# Patient Record
Sex: Male | Born: 1990 | Race: White | Hispanic: No | Marital: Single | Smoking: Former smoker
Health system: Southern US, Community
[De-identification: ages and names within clinical notes are randomized; demographics above are authoritative.]

## PROBLEM LIST (undated history)

## (undated) DIAGNOSIS — K219 Gastro-esophageal reflux disease without esophagitis: Secondary | ICD-10-CM

## (undated) DIAGNOSIS — B019 Varicella without complication: Secondary | ICD-10-CM

## (undated) HISTORY — DX: Gastro-esophageal reflux disease without esophagitis: K21.9

## (undated) HISTORY — DX: Varicella without complication: B01.9

---

## 2014-10-02 ENCOUNTER — Other Ambulatory Visit (INDEPENDENT_AMBULATORY_CARE_PROVIDER_SITE_OTHER): Payer: BLUE CROSS/BLUE SHIELD

## 2014-10-02 ENCOUNTER — Ambulatory Visit (INDEPENDENT_AMBULATORY_CARE_PROVIDER_SITE_OTHER): Payer: BLUE CROSS/BLUE SHIELD | Admitting: Family

## 2014-10-02 ENCOUNTER — Encounter: Payer: Self-pay | Admitting: Family

## 2014-10-02 ENCOUNTER — Telehealth: Payer: Self-pay | Admitting: Family

## 2014-10-02 VITALS — BP 134/78 | HR 95 | Temp 98.2°F | Resp 18 | Ht 70.0 in | Wt 190.0 lb

## 2014-10-02 DIAGNOSIS — R1013 Epigastric pain: Secondary | ICD-10-CM

## 2014-10-02 DIAGNOSIS — R748 Abnormal levels of other serum enzymes: Secondary | ICD-10-CM

## 2014-10-02 LAB — AMYLASE: AMYLASE: 46 U/L (ref 27–131)

## 2014-10-02 LAB — H. PYLORI ANTIBODY, IGG: H Pylori IgG: NEGATIVE

## 2014-10-02 LAB — LIPASE: LIPASE: 63 U/L — AB (ref 11.0–59.0)

## 2014-10-02 MED ORDER — PANTOPRAZOLE SODIUM 40 MG PO TBEC: 40.0000 mg | DELAYED_RELEASE_TABLET | Freq: Every day | ORAL | Status: AC

## 2014-10-02 NOTE — Telephone Encounter (Signed)
Please inform patient that his lipase was elevated, however the rest of his blood work was normal. This could be the potential for gall stones. Therefore I am placing an order for an abdominal ultrasound. Please continue to take the probiotic and pantoprazole at this time. We will follow up once he completes the ultrasound.

## 2014-10-02 NOTE — Progress Notes (Signed)
   Subjective:    Patient ID: Shawn Lee, male    DOB: [birth year], 24 y.o.   MRN: [id number]  Chief Complaint  Patient presents with  . Establish Care    States that he doesn't think that he digests food the way that he should, thinks it might be IBS, never been diagnosed    HPI:  [name] is a 24 y.o. male with no significant PMH who presents today for an office visit to establish care.   1.) Abdominal issues - Associated symptom of pain located in his upper and lower abdomen that has been going on and off for about 2 years has progressively worsened over the past month. Pain is described as a burning sensation. Pain is not effected by bowel movements. Notes that he can have multiple episodes of diarreha ranging from 1-4 per day. Greasy foods that has caused his symptoms. Averages about 3 meals per day consisting of red meat, pork, minimal vegetables and fruit, and some dairy. Denies blood in his stools. Modifying factors include TUMs which dulls the pain but then it returns. Empty stomach is worse. Timing of symptoms can be worse at night.   No Known Allergies  No current outpatient prescriptions on file prior to visit.   No current facility-administered medications on file prior to visit.     Review of Systems  Constitutional: Negative for fever and chills.  Gastrointestinal: Positive for abdominal pain and diarrhea. Negative for nausea, vomiting and blood in stool.      Objective:    BP 134/78 mmHg  Pulse 95  Temp(Src) 98.2 F (36.8 C) (Oral)  Resp 18  Ht  (1.778 m)  Wt 190 lb (86.183 kg)  BMI 27.26 kg/m2  SpO2 98% Nursing note and vital signs reviewed.  Physical Exam  Constitutional: He is oriented to person, place, and time. He appears well-developed and well-nourished. No distress.  Cardiovascular: Normal rate, regular rhythm, normal heart sounds and intact distal pulses.   Pulmonary/Chest: Effort normal and breath sounds normal.  Abdominal: Soft.  Normal appearance and bowel sounds are normal. There is tenderness in the right upper quadrant and epigastric area. There is no rigidity, no rebound, no guarding, no CVA tenderness, no tenderness at McBurney's point and negative Murphy's sign.  Neurological: He is alert and oriented to person, place, and time.  Skin: Skin is warm and dry.  Psychiatric: He has a normal mood and affect. His behavior is normal. Judgment and thought content normal.       Assessment & Plan:   Problem List Items Addressed This Visit      Other   Epigastric pain - Primary    Epigastric pain of undetermined origin, however cannot rule out gall stones, peptic ulcer disease, or irritable bowel syndrome. Obtain lipase, amylase, and H. Pylorii. Start pantoprazole. Start probiotic. Follow up pending lab work and trial of pantoprazole.       Relevant Medications   pantoprazole (PROTONIX) 40 MG tablet   Other Relevant Orders   Lipase (Completed)   Amylase (Completed)   H. pylori antibody, IgG (Completed)

## 2014-10-02 NOTE — Progress Notes (Signed)
Pre visit review using our clinic review tool, if applicable. No additional management support is needed unless otherwise documented below in the visit note. 

## 2014-10-02 NOTE — Assessment & Plan Note (Signed)
Epigastric pain of undetermined origin, however cannot rule out gall stones, peptic ulcer disease, or irritable bowel syndrome. Obtain lipase, amylase, and H. Pylorii. Start pantoprazole. Start probiotic. Follow up pending lab work and trial of pantoprazole.

## 2014-10-02 NOTE — Patient Instructions (Addendum)
Thank you for choosing [facility].  Summary/Instructions:  Your prescription(s) have been submitted to your pharmacy or been printed and provided for you. Please take as directed and contact our office if you believe you are having problem(s) with the medication(s) or have any questions.  Please stop by the lab on the basement level of the building for your blood work. Your results will be released to MyChart (or called to you) after review, usually within 72 hours after test completion. If any changes need to be made, you will be notified at that same time.  If your symptoms worsen or fail to improve, please contact our office for further instruction, or in case of emergency go directly to the emergency room at the closest medical facility.   Irritable Bowel Syndrome Irritable bowel syndrome (IBS) is caused by a disturbance of normal bowel function and is a common digestive disorder. You may also hear this condition called spastic colon, mucous colitis, and irritable colon. There is no cure for IBS. However, symptoms often gradually improve or disappear with a good diet, stress management, and medicine. This condition usually appears in late adolescence or early adulthood. Women develop it twice as often as men. CAUSES  After food has been digested and absorbed in the small intestine, waste material is moved into the large intestine, or colon. In the colon, water and salts are absorbed from the undigested products coming from the small intestine. The remaining residue, or fecal material, is held for elimination. Under normal circumstances, gentle, rhythmic contractions of the bowel walls push the fecal material along the colon toward the rectum. In IBS, however, these contractions are irregular and poorly coordinated. The fecal material is either retained too long, resulting in constipation, or expelled too soon, producing diarrhea. SIGNS AND SYMPTOMS  The most common symptom of IBS is  abdominal pain. It is often in the lower left side of the abdomen, but it may occur anywhere in the abdomen. The pain comes from spasms of the bowel muscles happening too much and from the buildup of gas and fecal material in the colon. This pain:  Can range from sharp abdominal cramps to a dull, continuous ache.  Often worsens soon after eating.  Is often relieved by having a bowel movement or passing gas. Abdominal pain is usually accompanied by constipation, but it may also produce diarrhea. The diarrhea often occurs right after a meal or upon waking up in the morning. The stools are often soft, watery, and flecked with mucus. Other symptoms of IBS include:  Bloating.  Loss of appetite.  Heartburn.  Backache.  Dull pain in the arms or shoulders.  Nausea.  Burping.  Vomiting.  Gas. IBS may also cause symptoms that are unrelated to the digestive system, such as:  Fatigue.  Headaches.  Anxiety.  Shortness of breath.  Trouble concentrating.  Dizziness. These symptoms tend to come and go. DIAGNOSIS  The symptoms of IBS may seem like symptoms of other, more serious digestive disorders. Your health care provider may want to perform tests to exclude these disorders.  TREATMENT Many medicines are available to help correct bowel function or relieve bowel spasms and abdominal pain. Among the medicines available are:  Laxatives for severe constipation and to help restore normal bowel habits.  Specific antidiarrheal medicines to treat severe or lasting diarrhea.  Antispasmodic agents to relieve intestinal cramps. Your health care provider may also decide to treat you with a mild tranquilizer or sedative during unusually stressful periods in  your life. Your health care provider may also prescribe antidepressant medicine. The use of this medicine has been shown to reduce pain and other symptoms of IBS. Remember that if any medicine is prescribed for you, you should take it  exactly as directed. Make sure your health care provider knows how well it worked for you. HOME CARE INSTRUCTIONS   Take all medicines as directed by your health care provider.  Avoid foods that are high in fat or oils, such as heavy cream, butter, frankfurters, sausage, and other fatty meats.  Avoid foods that make you go to the bathroom, such as fruit, fruit juice, and dairy products.  Cut out carbonated drinks, chewing gum, and "gassy" foods such as beans and cabbage. This may help relieve bloating and burping.  Eat foods with bran, and drink plenty of liquids with the bran foods. This helps relieve constipation.  Keep track of what foods seem to bring on your symptoms.  Avoid emotionally charged situations or circumstances that produce anxiety.  Start or continue exercising.  Get plenty of rest and sleep. Document Released: 02/27/2005 Document Revised: 03/04/2013 Document Reviewed: 10/18/2007 [facility] Patient Information 2015 [place], [place]. This information is not intended to replace advice given to you by your health care provider. Make sure you discuss any questions you have with your health care provider.  Peptic Ulcer A peptic ulcer is a sore in the lining of your esophagus (esophageal ulcer), stomach (gastric ulcer), or in the first part of your small intestine (duodenal ulcer). The ulcer causes erosion into the deeper tissue. CAUSES  Normally, the lining of the stomach and the small intestine protects itself from the acid that digests food. The protective lining can be damaged by:  An infection caused by a bacterium called Helicobacter pylori (H. pylori).  Regular use of nonsteroidal anti-inflammatory drugs (NSAIDs), such as ibuprofen or aspirin.  Smoking tobacco. Other risk factors include being older than 50, drinking alcohol excessively, and having a family history of ulcer disease.  SYMPTOMS   Burning pain or gnawing in the area between the chest and the belly  button.  Heartburn.  Nausea and vomiting.  Bloating. The pain can be worse on an empty stomach and at night. If the ulcer results in bleeding, it can cause:  Black, tarry stools.  Vomiting of bright red blood.  Vomiting of coffee-ground-looking materials. DIAGNOSIS  A diagnosis is usually made based upon your history and an exam. Other tests and procedures may be performed to find the cause of the ulcer. Finding a cause will help determine the best treatment. Tests and procedures may include:  Blood tests, stool tests, or breath tests to check for the bacterium H. pylori.  An upper gastrointestinal (GI) series of the esophagus, stomach, and small intestine.  An endoscopy to examine the esophagus, stomach, and small intestine.  A biopsy. TREATMENT  Treatment may include:  Eliminating the cause of the ulcer, such as smoking, NSAIDs, or alcohol.  Medicines to reduce the amount of acid in your digestive tract.  Antibiotic medicines if the ulcer is caused by the H. pylori bacterium.  An upper endoscopy to treat a bleeding ulcer.  Surgery if the bleeding is severe or if the ulcer created a hole somewhere in the digestive system. HOME CARE INSTRUCTIONS   Avoid tobacco, alcohol, and caffeine. Smoking can increase the acid in the stomach, and continued smoking will impair the healing of ulcers.  Avoid foods and drinks that seem to cause discomfort or aggravate your  ulcer.  Only take medicines as directed by your caregiver. Do not substitute over-the-counter medicines for prescription medicines without talking to your caregiver.  Keep any follow-up appointments and tests as directed. SEEK MEDICAL CARE IF:   Your do not improve within 7 days of starting treatment.  You have ongoing indigestion or heartburn. SEEK IMMEDIATE MEDICAL CARE IF:   You have sudden, sharp, or persistent abdominal pain.  You have bloody or dark black, tarry stools.  You vomit blood or vomit that  looks like coffee grounds.  You become light-headed, weak, or feel faint.  You become sweaty or clammy. MAKE SURE YOU:   Understand these instructions.  Will watch your condition.  Will get help right away if you are not doing well or get worse. Document Released: 02/25/2000 Document Revised: 07/14/2013 Document Reviewed: 09/27/2011 [facility] Patient Information 2015 [place], [place]. This information is not intended to replace advice given to you by your health care provider. Make sure you discuss any questions you have with your health care provider.

## 2014-10-06 NOTE — Telephone Encounter (Signed)
Pt aware.

## 2014-10-09 ENCOUNTER — Ambulatory Visit
Admission: RE | Admit: 2014-10-09 | Discharge: 2014-10-09 | Disposition: A | Discharge status: Home / Self Care | Payer: BLUE CROSS/BLUE SHIELD | Source: Ambulatory Visit | Attending: Family | Admitting: Family

## 2014-10-09 DIAGNOSIS — R748 Abnormal levels of other serum enzymes: Secondary | ICD-10-CM

## 2014-10-12 ENCOUNTER — Encounter: Payer: Self-pay | Admitting: Family

## 2014-10-12 DIAGNOSIS — R1013 Epigastric pain: Secondary | ICD-10-CM

## 2014-10-13 ENCOUNTER — Encounter: Payer: Self-pay | Admitting: Internal Medicine

## 2014-10-26 ENCOUNTER — Encounter: Payer: Self-pay | Admitting: Internal Medicine

## 2014-10-26 ENCOUNTER — Other Ambulatory Visit (INDEPENDENT_AMBULATORY_CARE_PROVIDER_SITE_OTHER): Payer: BLUE CROSS/BLUE SHIELD

## 2014-10-26 ENCOUNTER — Ambulatory Visit (INDEPENDENT_AMBULATORY_CARE_PROVIDER_SITE_OTHER): Payer: BLUE CROSS/BLUE SHIELD | Admitting: Internal Medicine

## 2014-10-26 VITALS — BP 102/52 | HR 72 | Ht 68.11 in | Wt 197.0 lb

## 2014-10-26 DIAGNOSIS — R197 Diarrhea, unspecified: Secondary | ICD-10-CM

## 2014-10-26 DIAGNOSIS — R932 Abnormal findings on diagnostic imaging of liver and biliary tract: Secondary | ICD-10-CM

## 2014-10-26 DIAGNOSIS — R1013 Epigastric pain: Secondary | ICD-10-CM

## 2014-10-26 LAB — CBC WITH DIFFERENTIAL/PLATELET
BASOS PCT: 0.5 % (ref 0.0–3.0)
Basophils Absolute: 0 10*3/uL (ref 0.0–0.1)
EOS PCT: 3.4 % (ref 0.0–5.0)
Eosinophils Absolute: 0.2 10*3/uL (ref 0.0–0.7)
HEMATOCRIT: 37.9 % — AB (ref 39.0–52.0)
HEMOGLOBIN: 12.4 g/dL — AB (ref 13.0–17.0)
LYMPHS PCT: 19.8 % (ref 12.0–46.0)
Lymphs Abs: 1.1 10*3/uL (ref 0.7–4.0)
MCHC: 32.6 g/dL (ref 30.0–36.0)
MCV: 79.7 fl (ref 78.0–100.0)
MONO ABS: 0.5 10*3/uL (ref 0.1–1.0)
MONOS PCT: 9.7 % (ref 3.0–12.0)
Neutro Abs: 3.6 10*3/uL (ref 1.4–7.7)
Neutrophils Relative %: 66.6 % (ref 43.0–77.0)
Platelets: 387 10*3/uL (ref 150.0–400.0)
RBC: 4.76 Mil/uL (ref 4.22–5.81)
RDW: 15.1 % (ref 11.5–15.5)
WBC: 5.4 10*3/uL (ref 4.0–10.5)

## 2014-10-26 LAB — COMPREHENSIVE METABOLIC PANEL
ALBUMIN: 3.8 g/dL (ref 3.5–5.2)
ALK PHOS: 59 U/L (ref 39–117)
ALT: 11 U/L (ref 0–53)
AST: 15 U/L (ref 0–37)
BUN: 10 mg/dL (ref 6–23)
CALCIUM: 8.9 mg/dL (ref 8.4–10.5)
CHLORIDE: 106 meq/L (ref 96–112)
CO2: 30 mEq/L (ref 19–32)
CREATININE: 0.97 mg/dL (ref 0.40–1.50)
GFR: 100.09 mL/min (ref >60.00)
Glucose, Bld: 97 mg/dL (ref 70–99)
POTASSIUM: 3.7 meq/L (ref 3.5–5.1)
Sodium: 142 mEq/L (ref 135–145)
TOTAL PROTEIN: 7.2 g/dL (ref 6.0–8.3)
Total Bilirubin: 0.3 mg/dL (ref 0.2–1.2)

## 2014-10-26 LAB — IGA: IgA: 306 mg/dL (ref 68–378)

## 2014-10-26 LAB — AMYLASE: AMYLASE: 52 U/L (ref 27–131)

## 2014-10-26 LAB — LIPASE: LIPASE: 70 U/L — AB (ref 11.0–59.0)

## 2014-10-26 LAB — C-REACTIVE PROTEIN: CRP: 1.4 mg/dL (ref 0.5–20.0)

## 2014-10-26 MED ORDER — BENEFIBER PO POWD: ORAL | Status: AC

## 2014-10-26 MED ORDER — DICYCLOMINE HCL 20 MG PO TABS: 20.0000 mg | ORAL_TABLET | Freq: Four times a day (QID) | ORAL | Status: AC | PRN

## 2014-10-26 MED ORDER — ALIGN 4 MG PO CAPS: 1.0000 | ORAL_CAPSULE | Freq: Every day | ORAL | Status: AC

## 2014-10-26 NOTE — Progress Notes (Signed)
   Subjective:    Patient ID: Shawn Lee, male    DOB: 08-Jul-1990, 24 y.o.   MRN: [id number] Cc: epigastric pain HPI 23 yo wm here c/o post prandial epigastric pain x 2 yrs. Recently moved here from [place]. He had been treated with yogurt - probiotic because of abdominal pain and altered bowel habits but persistnet issues w/ pc bloating and abdominal distress. Some loose stools after meals. Has ben Rx PPI w/ reduced epigastric pain. There has been rare 1-2 x bright red blood on paper months ago and not since. No wgt loss.PCP eval late July  abd [place] w/ 3.9 cm max echogenic R liver focus ? hemangioma, neg H pylori serology, mildly elevated lipase w/ NL amylase, CBC, CMET. 2+ alcoholic beveraged/day; 3 caffeinated beverages daily. Medications, allergies, past medical history, past surgical history, family history and social history are reviewed and updated in the EMR.   Review of Systems All other ROS negative    Objective:   Physical Exam  102/52 mmHg  Pulse 72  Ht 5' 8.11" (1.73 m)  Wt 197 lb (89.359 kg)  BMI 29.86 kg/m2@  General:  Well-developed, well-nourished and in no acute distress Eyes:  anicteric. ENT:   Mouth and posterior pharynx free of lesions.  Neck:   supple w/o thyromegaly or mass.  Lungs: Clear to auscultation bilaterally. Heart:  S1S2, no rubs, murmurs, gallops. Abdomen:  soft, non-tender, no hepatosplenomegaly, hernia, or mass and BS+.  Rectal: Small skin breakdown gluteal crease near coccyx, ? Small hemorrhoids internal - no mass and no stool Lymph:  no cervical or supraclavicular adenopathy. Extremities:   no edema, cyanosis or clubbing Skin   no rash.Hirsute Neuro:  A&O x 3.  Psych:  appropriate mood and  Affect.   Data Reviewed:  Per HPI      Assessment & Plan:  Abdominal pain, epigastric  Frequent loose stools  Abnormal liver ultrasound - suspect hemangioma   Suspect IBS  CBC, CMET, lipase and amylase again TTG Ab and IgA and CRP  RTC Oct  2016  Liver US 6 months  WU:[id number], Earl Lites, FNP    Celiac tests neg Lipase still mildly elevated = 70 Hgb 12.4 normocytic Will advise reduce alcohol  Reassess at f/u

## 2014-10-26 NOTE — Patient Instructions (Addendum)
You have been given a separate informational sheet regarding your tobacco use, the importance of quitting and local resources to help you quit.  Your physician has requested that you go to the basement for the  lab work before leaving today.   Today you have been given a handout to read on IBS.  Use one tablespoon of benefiber daily.   Take Align daily, it's an over the counter probiotic to put the good bacteria back into your colon.   Use dicyclomine  As needed , rx sent in to pharmacy.   You are due for a repeat liver ultrasound in 6 months.  We will notify you when that gets closer.  Dr. Leone Payor would like  to see you in October.  I appreciate the opportunity to care for you. Stan Head, MD, [facility]

## 2014-10-27 LAB — TISSUE TRANSGLUTAMINASE, IGA: TISSUE TRANSGLUTAMINASE AB, IGA: 1 U/mL (ref <4)

## 2014-10-29 ENCOUNTER — Encounter: Payer: Self-pay | Admitting: Internal Medicine

## 2014-10-29 NOTE — Progress Notes (Signed)
Quick Note:  Neg celiac testing Slight anemia Slightly elevated lipase ______

## 2015-04-15 ENCOUNTER — Telehealth: Payer: Self-pay

## 2015-04-15 DIAGNOSIS — R932 Abnormal findings on diagnostic imaging of liver and biliary tract: Secondary | ICD-10-CM

## 2015-04-15 NOTE — Telephone Encounter (Signed)
You have been scheduled for an abdominal ultrasound at [facility] (1st floor of hospital) on 05/07/15 at 11:00AM. Please arrive 15 minutes prior to your appointment for registration. Make certain not to have anything to eat or drink 6 hours prior to your appointment. Should you need to reschedule your appointment, please contact radiology at [phone]. This test typically takes about 30 minutes to perform.   Rollin was notified of the above appointment date/time/location/phone #.

## 2015-04-15 NOTE — Telephone Encounter (Signed)
-----   Message from [place], [place] sent at 10/26/2014  5:24 PM EDT ----- Due for liver u/s in end of Feb 2017, for abnormal liver u/s, suspect hemangioma per Dr Shelle Iron

## 2015-05-07 ENCOUNTER — Ambulatory Visit (HOSPITAL_COMMUNITY)
Admission: RE | Admit: 2015-05-07 | Discharge: 2015-05-07 | Disposition: A | Discharge status: Home / Self Care | Payer: 59 | Source: Ambulatory Visit | Attending: Internal Medicine | Admitting: Internal Medicine

## 2015-05-07 DIAGNOSIS — R932 Abnormal findings on diagnostic imaging of liver and biliary tract: Secondary | ICD-10-CM | POA: Diagnosis not present

## 2015-05-07 DIAGNOSIS — K7689 Other specified diseases of liver: Secondary | ICD-10-CM | POA: Insufficient documentation

## 2015-05-07 NOTE — Progress Notes (Signed)
Quick Note:  The liver lesion is same size and we think it is a benign collection of blood vessels called a hemangioma. No further testing planned.  I had recommended he return to see me in the Fall re: sxs he was having and still recommend he schedule a follow-up for that. ______

## 2016-11-06 IMAGING — US US ABDOMEN COMPLETE
1 series · 13 of 25 positions shown · non-contrast
Comparison: None.

CLINICAL DATA: Elevated serum lipase

EXAM:
ULTRASOUND ABDOMEN COMPLETE

[Series 1: us abdomen complete · 0.22mm/px · 13 of 77 slices shown]
[im 1/77]
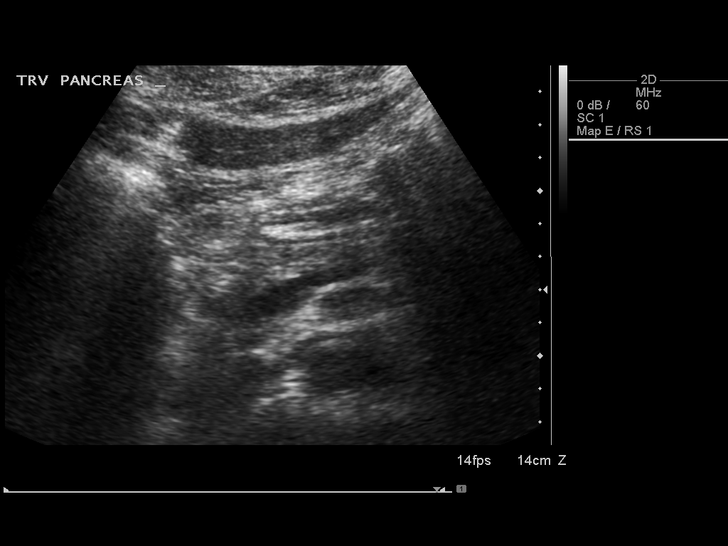
[im 7/77]
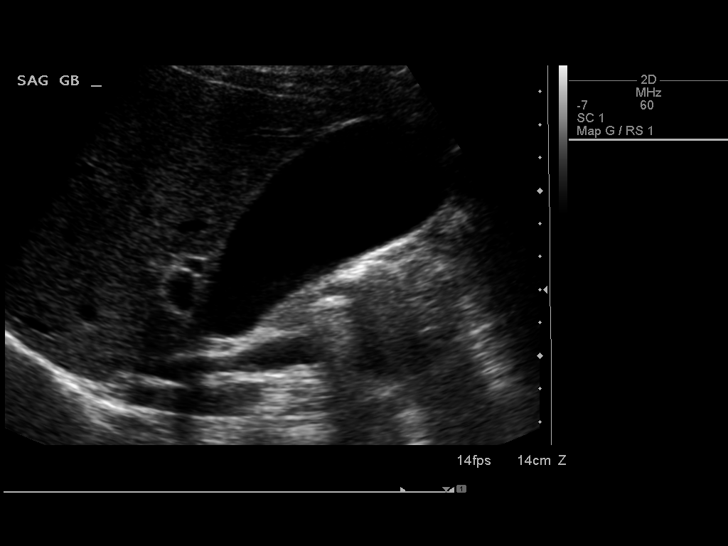
[im 13/77]
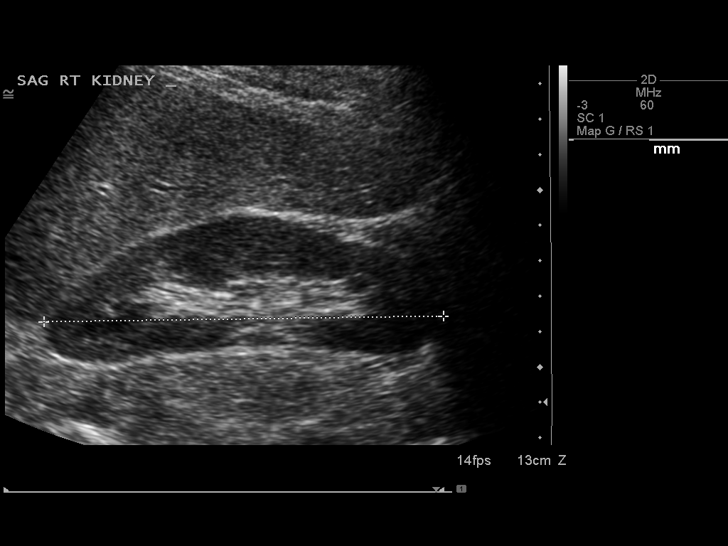
[im 20/77]
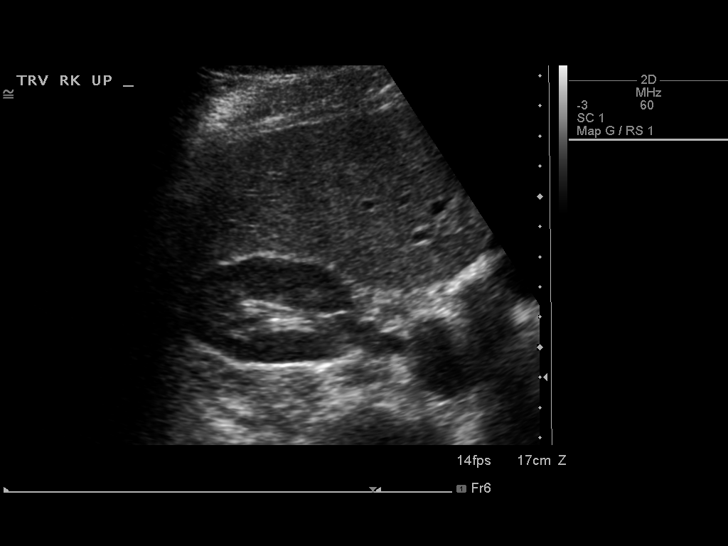
[im 26/77]
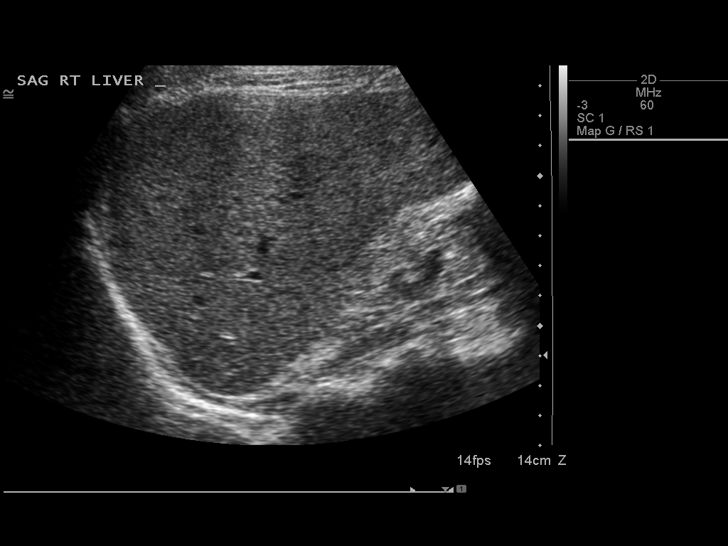
[im 32/77]
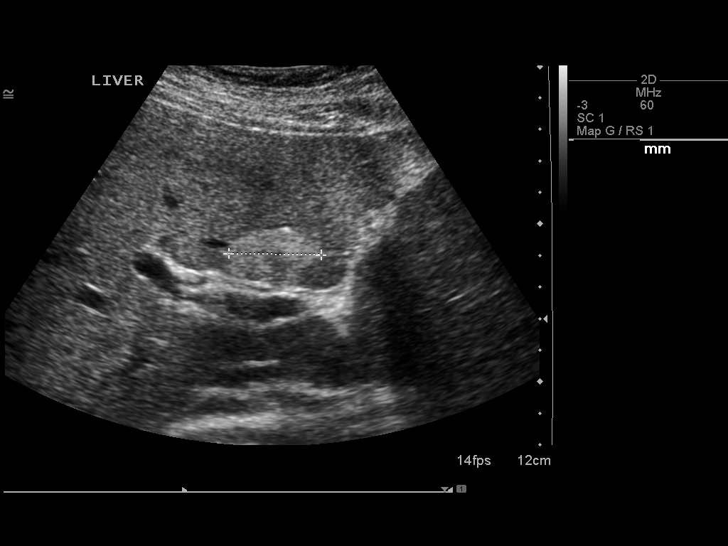
[im 39/77]
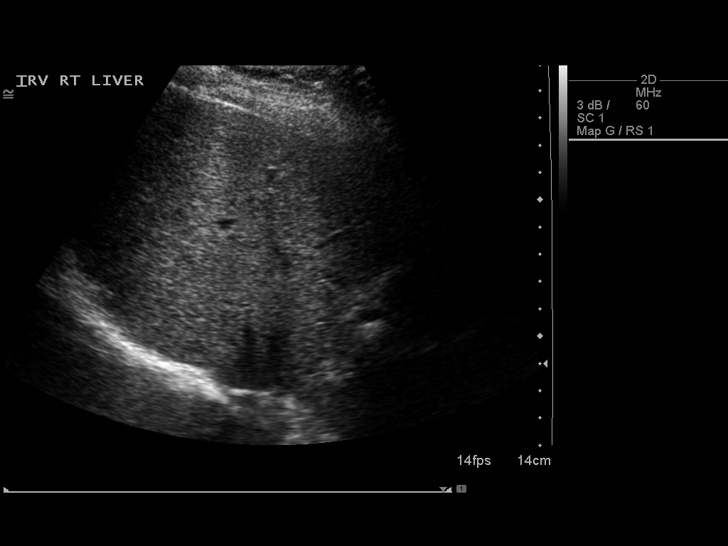
[im 45/77]
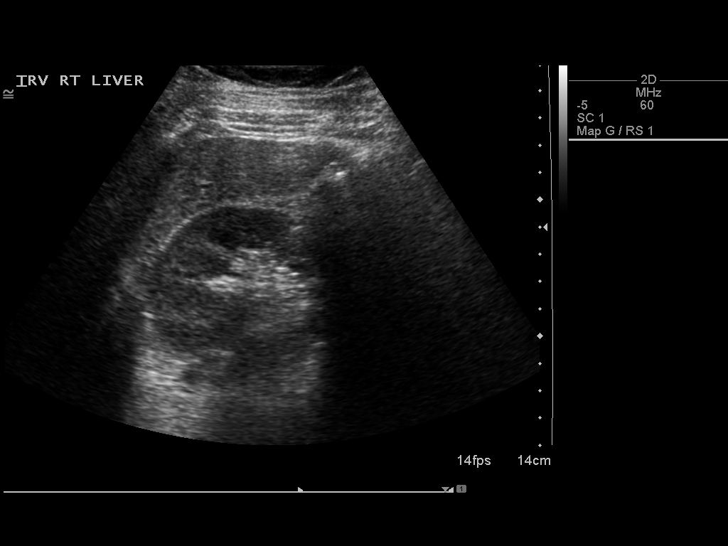
[im 51/77]
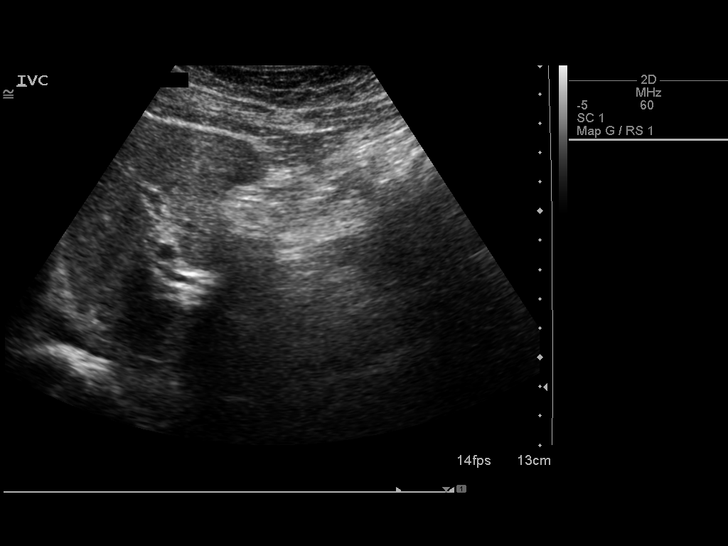
[im 58/77]
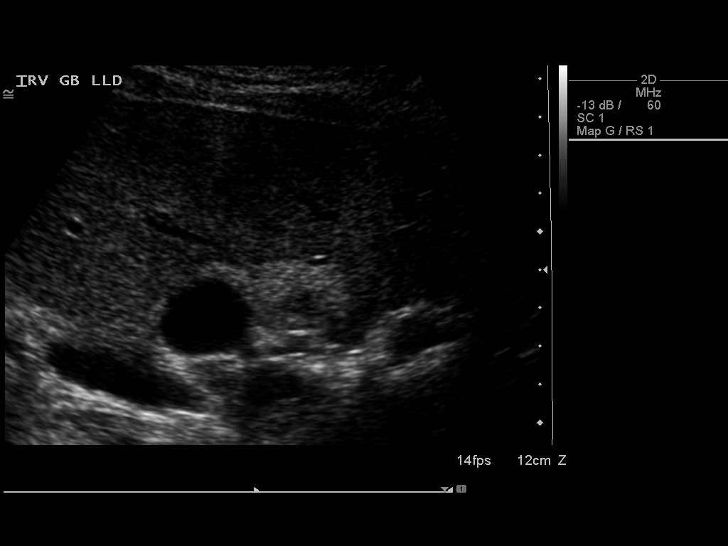
[im 64/77]
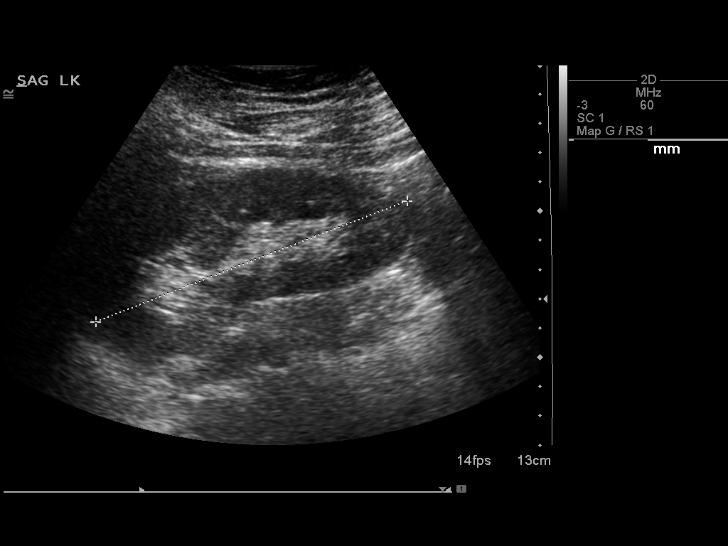
[im 70/77]
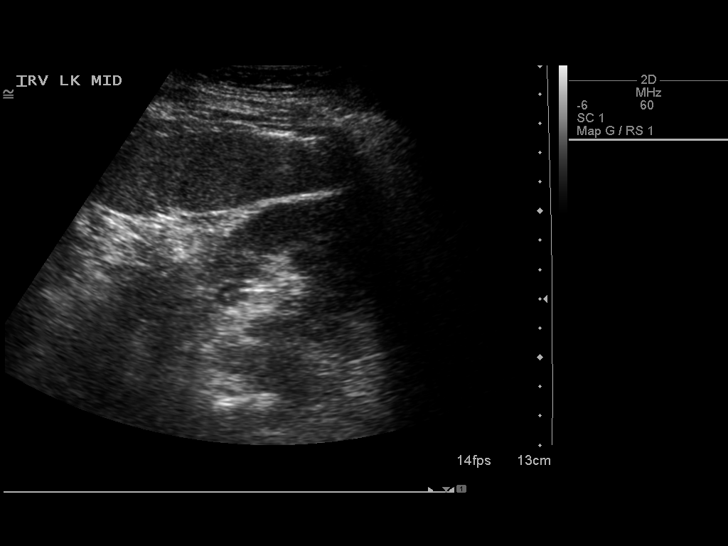
[im 77/77]
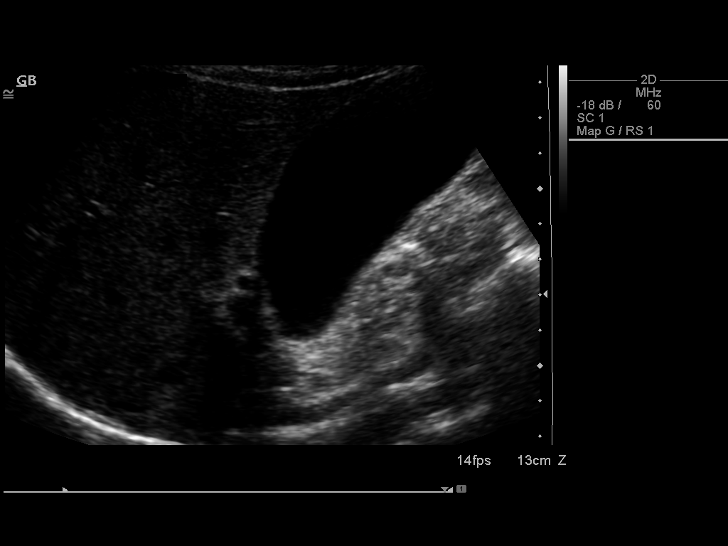

[13 of 25 positions shown; findings below may reference images not displayed]

FINDINGS: Gallbladder: No gallstones or wall thickening visualized. There is
no pericholecystic fluid. No sonographic Murphy sign noted.

Common bile duct: Diameter: 3 mm. There is no intrahepatic, common
hepatic, or common bile duct dilatation.

Liver: There is an echogenic focus in the anterior segment of the
right lobe of the liver measuring 3.9 x 1.7 x 2.9 cm. Overall, the
echogenicity of the liver is felt to be within normal limits.

IVC: No abnormality visualized.

Pancreas: Visualized portion unremarkable. Portions of the pancreas
are obscured by gas.

Spleen: Size and appearance within normal limits.

Right Kidney: Length: 11.3 cm. Echogenicity within normal limits. No
mass or hydronephrosis visualized.

Left Kidney: Length: 11.7 cm. Echogenicity within normal limits. No
mass or hydronephrosis visualized.

Abdominal aorta: No aneurysm visualized.

Other findings: No demonstrable ascites.
IMPRESSION: Suspect hemangioma in the anterior segment right lobe of the liver.
As there are no prior studies to compare, advise followup study in
approximately 6 months to further assess.

Portions of pancreas obscured by gas. Visualized portions of
pancreas appear normal.

No gallbladder pathology demonstrable.

Study otherwise within normal limits.

## 2023-11-20 ENCOUNTER — Ambulatory Visit
Admission: EM | Admit: 2023-11-20 | Discharge: 2023-11-20 | Disposition: A | Discharge status: Home / Self Care | Source: Ambulatory Visit | Attending: Family Medicine | Admitting: Family Medicine

## 2023-11-20 DIAGNOSIS — H109 Unspecified conjunctivitis: Secondary | ICD-10-CM | POA: Diagnosis not present

## 2023-11-20 MED ORDER — TOBRAMYCIN 0.3 % OP SOLN: 1.0000 [drp] | OPHTHALMIC | 0 refills | Status: AC

## 2023-11-20 NOTE — ED Triage Notes (Signed)
 Pt c/o left eye redness, drainage and irritation x 1 week-was using family member's expired eye drops-NAD-steady gait

## 2023-11-20 NOTE — ED Provider Notes (Signed)
 [facility]  Note:  This document was prepared using Conservation officer, historic buildings and may include unintentional dictation errors.  MRN: [id number] DOB: 02-Apr-1990  Subjective:   Shawn Lee is a 33 y.o. male presenting for 1 week history of persistent left eye redness, drainage and irritation, haziness, blurred vision.  Was using leftover expired Polytrim from one of his family members.  No eye trauma.  No eyeglass or contact lens use.  No history of glaucoma.  No current facility-administered medications for this encounter.  Current Outpatient Medications:    dicyclomine  (BENTYL ) 20 MG tablet, Take 1 tablet (20 mg total) by mouth every 6 (six) hours as needed for spasms (abdominal pain)., Disp: 120 tablet, Rfl: 1   Melatonin 3 MG CAPS, Take by mouth., Disp: , Rfl:    pantoprazole  (PROTONIX ) 40 MG tablet, Take 1 tablet (40 mg total) by mouth daily., Disp: 30 tablet, Rfl: 3   Probiotic Product (ALIGN) 4 MG CAPS, Take 1 capsule by mouth daily., Disp: , Rfl:    Wheat Dextrin (BENEFIBER) POWD, 1 tablespoon daily, Disp: , Rfl: 0   No Known Allergies  Past Medical History:  Diagnosis Date   Chicken pox    GERD (gastroesophageal reflux disease)      History reviewed. No pertinent surgical history.  Family History  Problem Relation Age of Onset   Healthy Mother    Colon cancer Maternal Grandmother        colon   Diabetes Maternal Grandfather    Lung cancer Maternal Grandfather    Prostate cancer Paternal Grandfather     Social History   Tobacco Use   Smoking status: Former    Types: Cigarettes, Cigars   Smokeless tobacco: Never  Vaping Use   Vaping status: Never Used  Substance Use Topics   Alcohol use: Not Currently   Drug use: No    ROS   Objective:   Vitals: BP 130/86 (BP Location: Left Arm)   Pulse 81   Temp 98.3 F (36.8 C) (Oral)   Resp 16   SpO2 97%   Physical Exam Constitutional:      General: He is not in acute  distress.    Appearance: Normal appearance. He is well-developed and normal weight. He is not ill-appearing, toxic-appearing or diaphoretic.  HENT:     Head: Normocephalic and atraumatic.     Right Ear: External ear normal.     Left Ear: External ear normal.     Nose: Nose normal.     Mouth/Throat:     Pharynx: Oropharynx is clear.  Eyes:     General: Lids are everted, no foreign bodies appreciated. No scleral icterus.       Right eye: No foreign body, discharge or hordeolum.        Left eye: Discharge present.No foreign body or hordeolum.     Extraocular Movements: Extraocular movements intact.     Conjunctiva/sclera:     Right eye: Right conjunctiva is not injected. No chemosis, exudate or hemorrhage.    Left eye: Left conjunctiva is injected. No chemosis, exudate or hemorrhage.    Pupils: Pupils are equal, round, and reactive to light.  Cardiovascular:     Rate and Rhythm: Normal rate.  Pulmonary:     Effort: Pulmonary effort is normal.  Musculoskeletal:     Cervical back: Normal range of motion.  Neurological:     Mental Status: He is alert and oriented to person, place, and time.  Psychiatric:  Mood and Affect: Mood normal.        Behavior: Behavior normal.        Thought Content: Thought content normal.        Judgment: Judgment normal.     Assessment and Plan :   PDMP not reviewed this encounter.  1. Bacterial conjunctivitis of left eye    Will start tobramycin  to address bacterial conjunctivitis of left eye.  Follow-up with Dr. Pecen if symptoms persist for further workup, rule out of glaucoma, iritis.  Counseled patient on potential for adverse effects with medications prescribed/recommended today, ER and return-to-clinic precautions discussed, patient verbalized understanding.    Christopher Savannah, [place] 11/20/23 1907
